# Patient Record
Sex: Male | Born: 1955 | Race: White | Hispanic: No | Marital: Married
Health system: Southern US, Community
[De-identification: ages and names within clinical notes are randomized; demographics above are authoritative.]

---

## 2014-05-18 ENCOUNTER — Other Ambulatory Visit: Payer: Self-pay | Admitting: Physical Medicine and Rehabilitation

## 2014-05-18 DIAGNOSIS — N281 Cyst of kidney, acquired: Secondary | ICD-10-CM

## 2014-05-20 ENCOUNTER — Ambulatory Visit
Admission: RE | Admit: 2014-05-20 | Discharge: 2014-05-20 | Disposition: A | Discharge status: Home / Self Care | Payer: BC Managed Care – PPO | Source: Ambulatory Visit | Attending: Physical Medicine and Rehabilitation | Admitting: Physical Medicine and Rehabilitation

## 2014-05-20 DIAGNOSIS — N281 Cyst of kidney, acquired: Secondary | ICD-10-CM

## 2016-03-30 IMAGING — US US RENAL
1 series · 14 of 25 positions shown · non-contrast
Comparison: Lumbar MRI, 05/09/2014

CLINICAL DATA: Renal cyst.

EXAM:
RENAL/URINARY TRACT ULTRASOUND COMPLETE

[Series 1: us renal · 0.26mm/px · 14 of 33 slices shown]
[im 1/33]
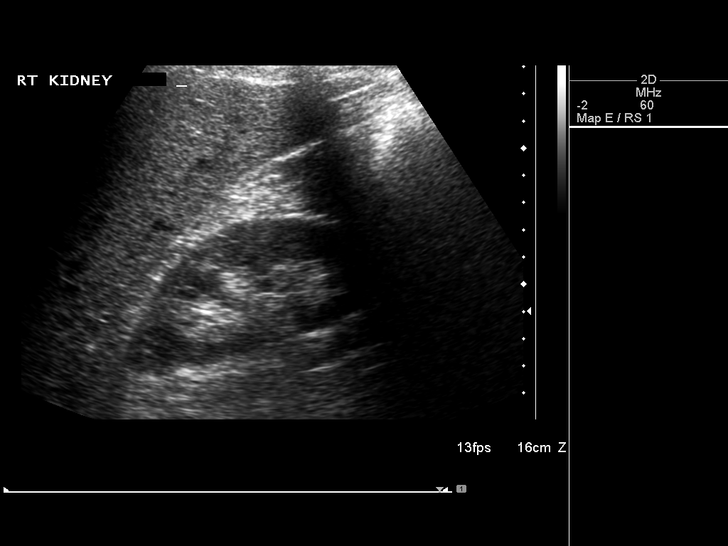
[im 3/33]
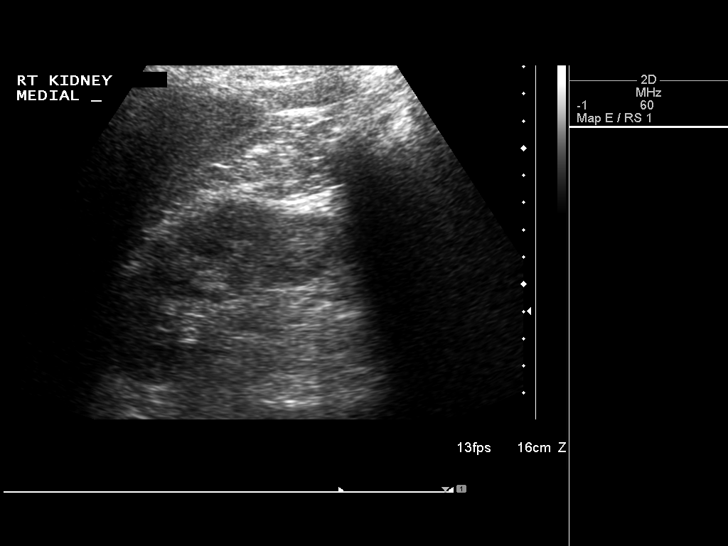
[im 6/33]
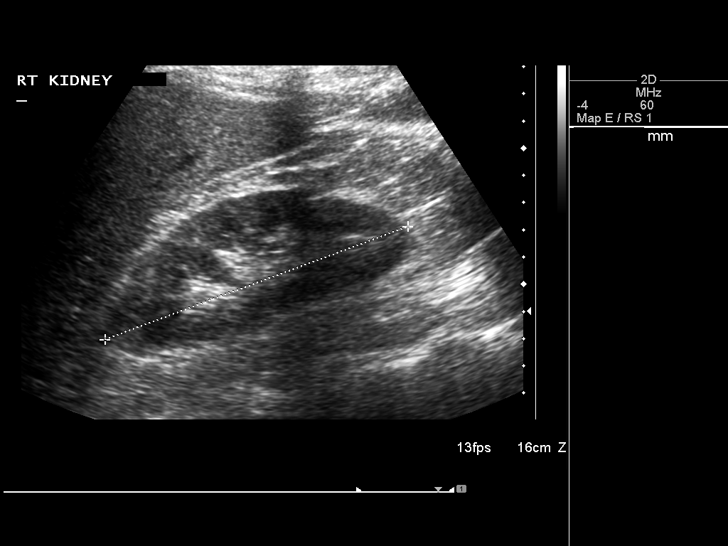
[im 9/33]
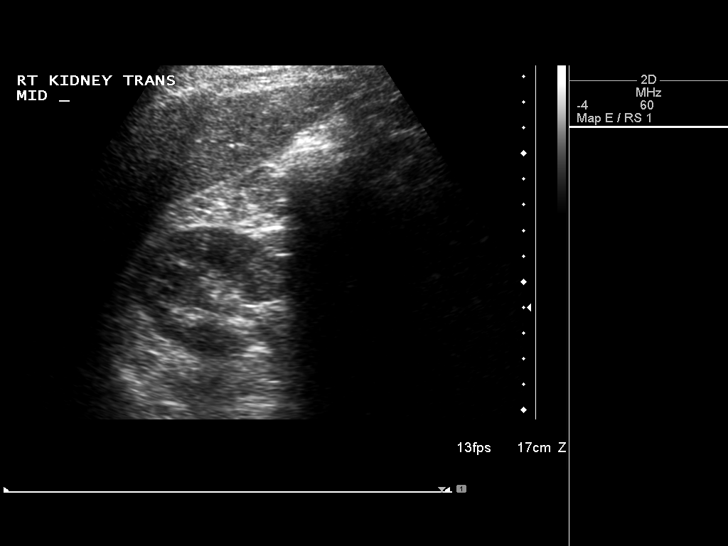
[im 11/33]
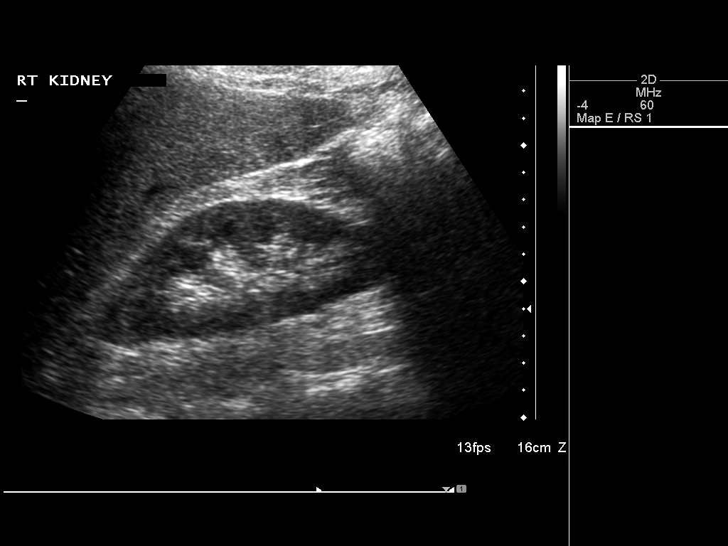
[im 13/33]
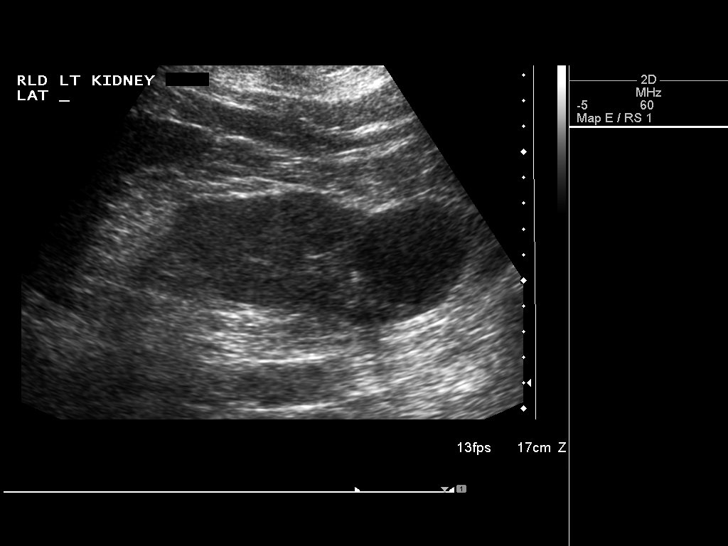
[im 15/33]
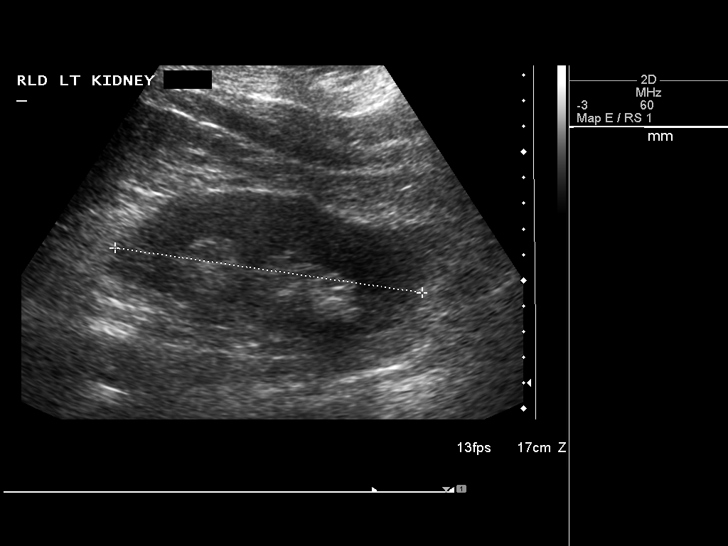
[im 18/33]
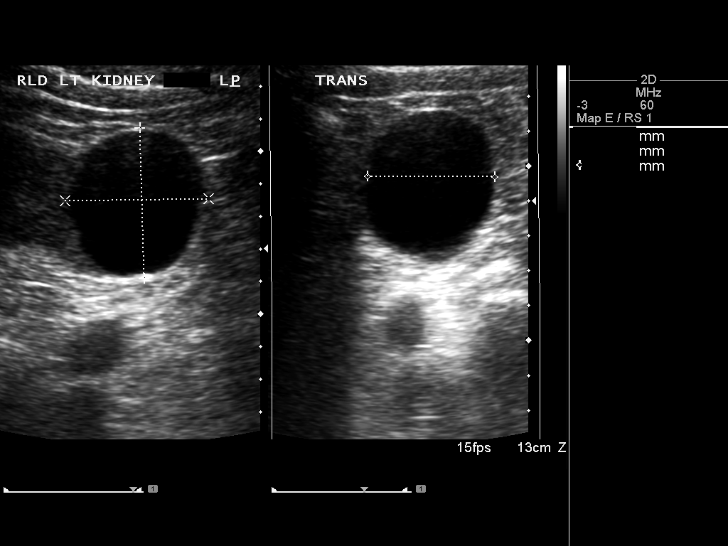
[im 21/33]
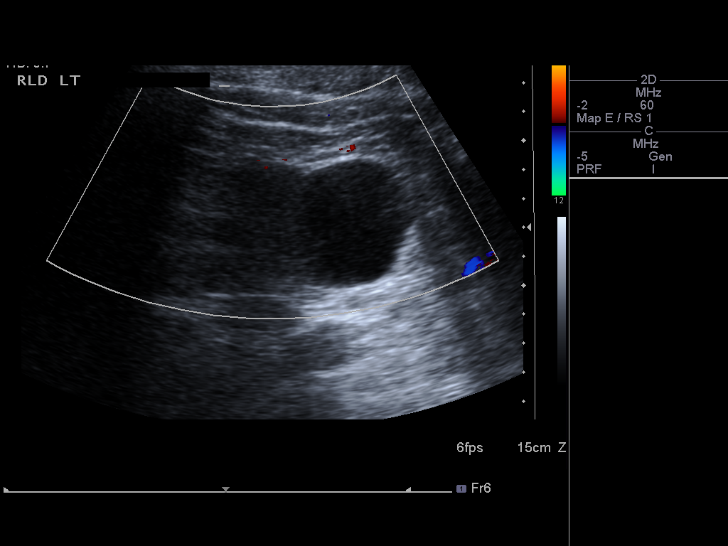
[im 22/33]
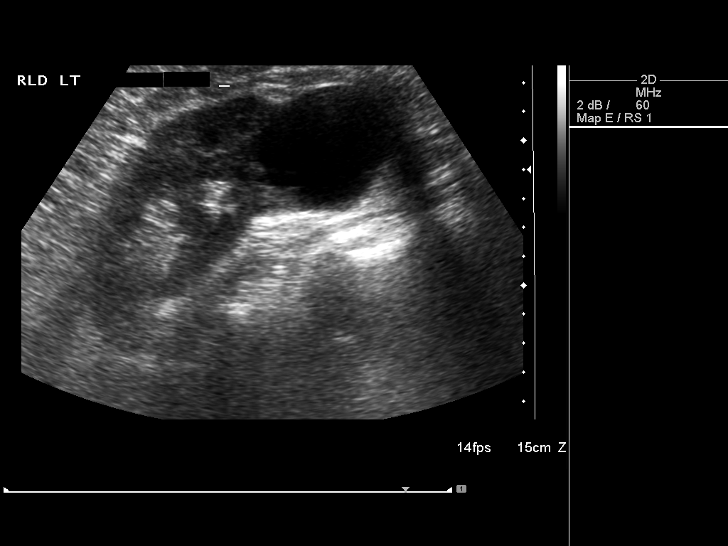
[im 25/33]
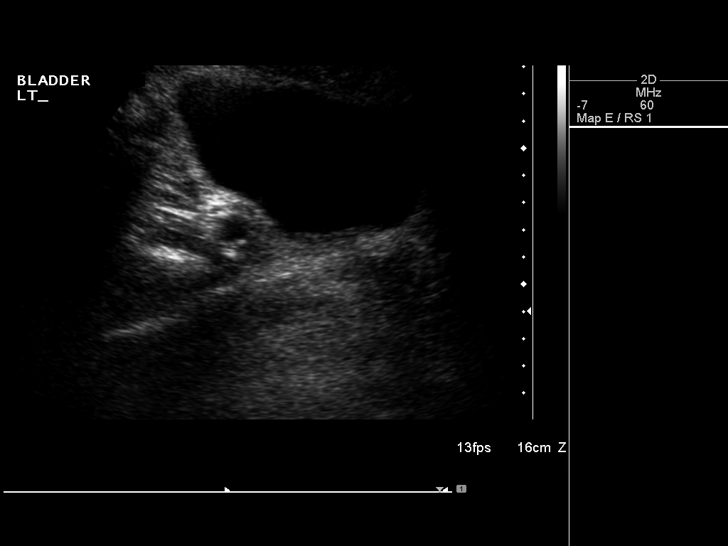
[im 27/33]
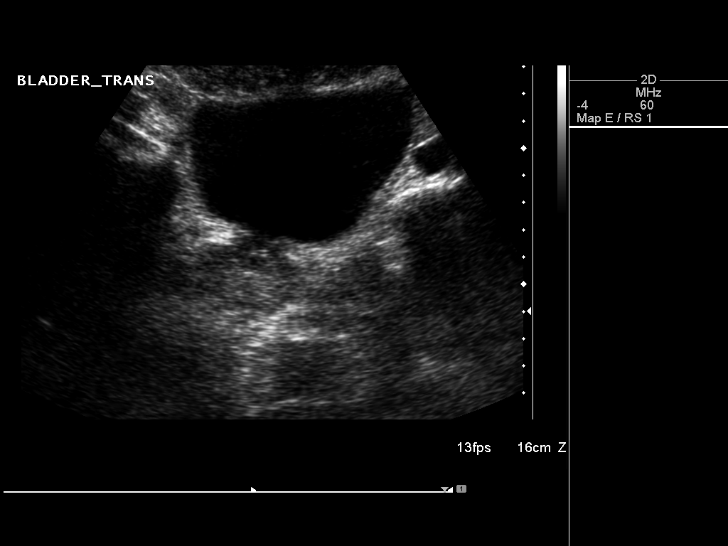
[im 30/33]
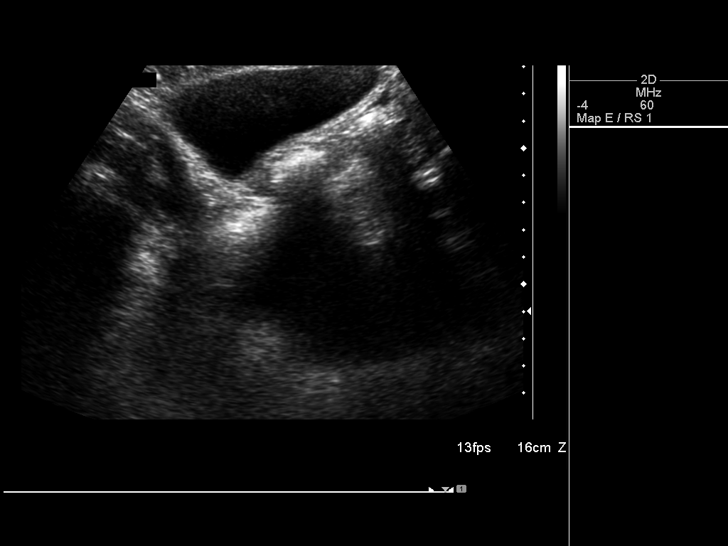
[im 33/33]
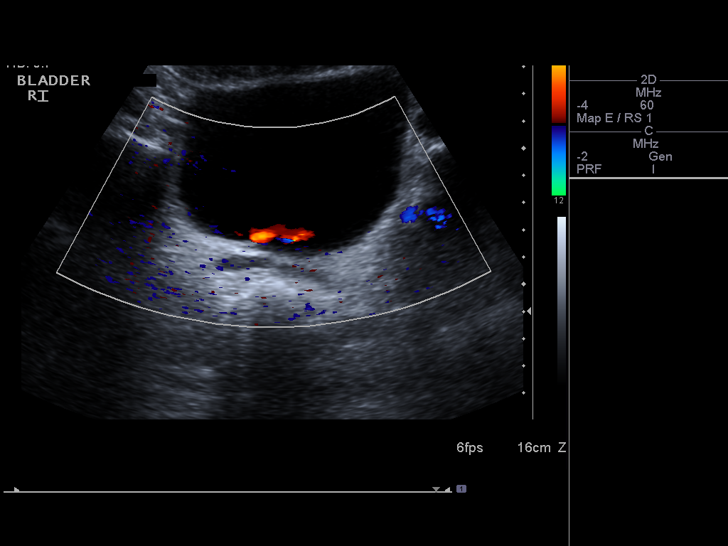

[14 of 25 positions shown; findings below may reference images not displayed]

FINDINGS: Right Kidney:

Length: 12.3 cm. Echogenicity within normal limits. No mass or
hydronephrosis visualized.

Left Kidney:

Length: 12.9 cm. Simple cyst arises from the lower pole measuring
4.6 cm x 4.4 cm x 3.7 cm. No other renal masses. Normal parenchymal
echogenicity. No hydronephrosis.

Bladder:

Appears normal for degree of bladder distention.
IMPRESSION: 1. Lesion seen in the left kidney on the recent MRI is a simple left
renal cyst. No further evaluation is indicated.
2. No other abnormalities.

## 2019-05-28 ENCOUNTER — Other Ambulatory Visit: Payer: Self-pay

## 2019-05-28 DIAGNOSIS — Z20822 Contact with and (suspected) exposure to covid-19: Secondary | ICD-10-CM

## 2019-05-29 LAB — NOVEL CORONAVIRUS, NAA: SARS-CoV-2, NAA: NOT DETECTED

## 2019-05-31 ENCOUNTER — Telehealth: Payer: Self-pay | Admitting: General Practice

## 2019-05-31 NOTE — Telephone Encounter (Signed)
Patient called in for the results of his Covid-19 test.  He was told that Covid was Not Detected. °

## 2019-11-25 ENCOUNTER — Ambulatory Visit: Payer: Self-pay

## 2019-11-29 ENCOUNTER — Other Ambulatory Visit: Payer: Self-pay

## 2019-11-29 ENCOUNTER — Ambulatory Visit: Payer: Self-pay | Attending: Family

## 2019-11-29 DIAGNOSIS — Z23 Encounter for immunization: Secondary | ICD-10-CM | POA: Insufficient documentation

## 2019-11-29 NOTE — Progress Notes (Signed)
   Covid-19 Vaccination Clinic  Name:  Andrew Bartlett    MRN: [id number] DOB: 1956/05/11  11/29/2019  [name] was observed post Covid-19 immunization for 15 minutes without incidence. He was provided with Vaccine Information Sheet and instruction to access the V-Safe system.   [name] was instructed to call 911 with any severe reactions post vaccine: Marland Kitchen Difficulty breathing  . Swelling of your face and throat  . A fast heartbeat  . A bad rash all over your body  . Dizziness and weakness    Immunizations Administered    Name Date Dose VIS Date Route   Moderna COVID-19 Vaccine 11/29/2019 10:28 AM 0.5 mL 09/07/2019 Intramuscular   Manufacturer: Moderna   Lot: [id number]   NDC: 46002-984-73

## 2019-12-28 ENCOUNTER — Ambulatory Visit: Payer: Self-pay | Attending: Family

## 2019-12-28 DIAGNOSIS — Z23 Encounter for immunization: Secondary | ICD-10-CM

## 2019-12-28 NOTE — Progress Notes (Signed)
   Covid-19 Vaccination Clinic  Name:  Andrew Bartlett    MRN: [id number] DOB: 1956-04-08  12/28/2019  Andrew Bartlett was observed post Covid-19 immunization for 15 minutes without incident. He was provided with Vaccine Information Sheet and instruction to access the V-Safe system.   Andrew Bartlett was instructed to call 911 with any severe reactions post vaccine: Marland Kitchen Difficulty breathing  . Swelling of face and throat  . A fast heartbeat  . A bad rash all over body  . Dizziness and weakness   Immunizations Administered    Name Date Dose VIS Date Route   Moderna COVID-19 Vaccine 12/28/2019  4:01 PM 0.5 mL 09/07/2019 Intramuscular   Manufacturer: Moderna   Lot: [id number]   NDC: 37048-889-16
